# Patient Record
Sex: Female | Born: 1945 | Race: White | Hispanic: No | Marital: Married | Smoking: Never smoker
Health system: Southern US, Community
[De-identification: ages and names within clinical notes are randomized; demographics above are authoritative.]

## PROBLEM LIST (undated history)

## (undated) DIAGNOSIS — C541 Malignant neoplasm of endometrium: Secondary | ICD-10-CM

## (undated) DIAGNOSIS — R002 Palpitations: Secondary | ICD-10-CM

## (undated) DIAGNOSIS — K279 Peptic ulcer, site unspecified, unspecified as acute or chronic, without hemorrhage or perforation: Secondary | ICD-10-CM

## (undated) DIAGNOSIS — K219 Gastro-esophageal reflux disease without esophagitis: Secondary | ICD-10-CM

## (undated) DIAGNOSIS — N879 Dysplasia of cervix uteri, unspecified: Secondary | ICD-10-CM

## (undated) DIAGNOSIS — N841 Polyp of cervix uteri: Secondary | ICD-10-CM

## (undated) HISTORY — DX: Dysplasia of cervix uteri, unspecified: N87.9

## (undated) HISTORY — DX: Peptic ulcer, site unspecified, unspecified as acute or chronic, without hemorrhage or perforation: K27.9

## (undated) HISTORY — DX: Palpitations: R00.2

## (undated) HISTORY — PX: DILATION AND CURETTAGE OF UTERUS: SHX78

## (undated) HISTORY — PX: FOOT SURGERY: SHX648

## (undated) HISTORY — PX: TUBAL LIGATION: SHX77

## (undated) HISTORY — DX: Polyp of cervix uteri: N84.1

## (undated) HISTORY — PX: LEEP: SHX91

## (undated) HISTORY — DX: Malignant neoplasm of endometrium: C54.1

## (undated) HISTORY — PX: CHOLECYSTECTOMY: SHX55

---

## 2005-04-11 ENCOUNTER — Ambulatory Visit: Payer: Self-pay | Admitting: Family Medicine

## 2005-04-11 ENCOUNTER — Other Ambulatory Visit: Admission: RE | Admit: 2005-04-11 | Discharge: 2005-04-11 | Payer: Self-pay | Admitting: Family Medicine

## 2005-04-11 ENCOUNTER — Encounter: Payer: Self-pay | Admitting: Family Medicine

## 2005-05-04 ENCOUNTER — Ambulatory Visit: Payer: Self-pay | Admitting: Family Medicine

## 2005-09-26 ENCOUNTER — Ambulatory Visit: Payer: Self-pay | Admitting: Family Medicine

## 2006-01-29 ENCOUNTER — Ambulatory Visit: Payer: Self-pay | Admitting: Family Medicine

## 2006-04-03 DIAGNOSIS — B009 Herpesviral infection, unspecified: Secondary | ICD-10-CM | POA: Insufficient documentation

## 2006-04-03 DIAGNOSIS — G43909 Migraine, unspecified, not intractable, without status migrainosus: Secondary | ICD-10-CM | POA: Insufficient documentation

## 2006-04-03 DIAGNOSIS — J309 Allergic rhinitis, unspecified: Secondary | ICD-10-CM | POA: Insufficient documentation

## 2006-04-03 DIAGNOSIS — N952 Postmenopausal atrophic vaginitis: Secondary | ICD-10-CM | POA: Insufficient documentation

## 2006-04-03 DIAGNOSIS — K219 Gastro-esophageal reflux disease without esophagitis: Secondary | ICD-10-CM | POA: Insufficient documentation

## 2006-04-04 LAB — HM COLONOSCOPY: HM Colonoscopy: NORMAL

## 2006-04-22 ENCOUNTER — Encounter: Payer: Self-pay | Admitting: Family Medicine

## 2006-04-22 DIAGNOSIS — N879 Dysplasia of cervix uteri, unspecified: Secondary | ICD-10-CM | POA: Insufficient documentation

## 2006-08-03 ENCOUNTER — Ambulatory Visit: Payer: Self-pay | Admitting: Family Medicine

## 2006-08-03 DIAGNOSIS — I499 Cardiac arrhythmia, unspecified: Secondary | ICD-10-CM | POA: Insufficient documentation

## 2006-08-09 ENCOUNTER — Encounter: Payer: Self-pay | Admitting: Family Medicine

## 2006-08-15 ENCOUNTER — Ambulatory Visit: Payer: Self-pay | Admitting: Cardiology

## 2006-08-15 ENCOUNTER — Encounter: Payer: Self-pay | Admitting: Family Medicine

## 2006-08-20 ENCOUNTER — Ambulatory Visit: Payer: Self-pay | Admitting: Family Medicine

## 2006-08-31 ENCOUNTER — Encounter: Payer: Self-pay | Admitting: Cardiology

## 2006-08-31 ENCOUNTER — Ambulatory Visit: Payer: Self-pay

## 2006-09-06 ENCOUNTER — Encounter: Payer: Self-pay | Admitting: Family Medicine

## 2007-05-17 ENCOUNTER — Ambulatory Visit: Payer: Self-pay | Admitting: Family Medicine

## 2007-05-30 ENCOUNTER — Ambulatory Visit: Payer: Self-pay | Admitting: Family Medicine

## 2007-05-30 ENCOUNTER — Encounter: Admission: RE | Admit: 2007-05-30 | Discharge: 2007-05-30 | Payer: Self-pay | Admitting: Family Medicine

## 2008-03-23 ENCOUNTER — Other Ambulatory Visit: Admission: RE | Admit: 2008-03-23 | Discharge: 2008-03-23 | Payer: Self-pay | Admitting: Family Medicine

## 2008-03-23 ENCOUNTER — Encounter: Payer: Self-pay | Admitting: Family Medicine

## 2008-03-23 ENCOUNTER — Ambulatory Visit: Payer: Self-pay | Admitting: Family Medicine

## 2008-03-23 DIAGNOSIS — N951 Menopausal and female climacteric states: Secondary | ICD-10-CM | POA: Insufficient documentation

## 2008-03-24 ENCOUNTER — Encounter: Payer: Self-pay | Admitting: Family Medicine

## 2008-03-25 ENCOUNTER — Encounter: Payer: Self-pay | Admitting: Family Medicine

## 2008-03-26 ENCOUNTER — Encounter: Payer: Self-pay | Admitting: Family Medicine

## 2008-03-26 DIAGNOSIS — M949 Disorder of cartilage, unspecified: Secondary | ICD-10-CM

## 2008-03-26 DIAGNOSIS — M899 Disorder of bone, unspecified: Secondary | ICD-10-CM | POA: Insufficient documentation

## 2008-03-27 LAB — CONVERTED CEMR LAB
AST: 14 units/L (ref 0–37)
Calcium: 9.1 mg/dL (ref 8.4–10.5)
Chloride: 107 meq/L (ref 96–112)
Cholesterol: 172 mg/dL (ref 0–200)
Glucose, Bld: 106 mg/dL — ABNORMAL HIGH (ref 70–99)
Hemoglobin: 12.6 g/dL (ref 12.0–15.0)
LDL Cholesterol: 106 mg/dL — ABNORMAL HIGH (ref 0–99)
MCHC: 31.7 g/dL (ref 30.0–36.0)
MCV: 90.6 fL (ref 78.0–100.0)
Platelets: 230 10*3/uL (ref 150–400)
Potassium: 4.2 meq/L (ref 3.5–5.3)
RDW: 14.1 % (ref 11.5–15.5)
Sodium: 142 meq/L (ref 135–145)
Total Protein: 6.4 g/dL (ref 6.0–8.3)
VLDL: 22 mg/dL (ref 0–40)

## 2008-04-02 ENCOUNTER — Encounter: Payer: Self-pay | Admitting: Family Medicine

## 2008-04-02 ENCOUNTER — Ambulatory Visit: Payer: Self-pay | Admitting: Family Medicine

## 2008-04-02 DIAGNOSIS — R7301 Impaired fasting glucose: Secondary | ICD-10-CM | POA: Insufficient documentation

## 2008-04-03 ENCOUNTER — Telehealth (INDEPENDENT_AMBULATORY_CARE_PROVIDER_SITE_OTHER): Payer: Self-pay | Admitting: *Deleted

## 2008-04-03 LAB — CONVERTED CEMR LAB: Vit D, 1,25-Dihydroxy: 32 (ref 30–89)

## 2008-04-07 ENCOUNTER — Encounter: Payer: Self-pay | Admitting: Family Medicine

## 2008-04-10 ENCOUNTER — Encounter: Payer: Self-pay | Admitting: Family Medicine

## 2009-07-08 ENCOUNTER — Ambulatory Visit: Payer: Self-pay | Admitting: Family Medicine

## 2009-07-08 DIAGNOSIS — R002 Palpitations: Secondary | ICD-10-CM | POA: Insufficient documentation

## 2009-07-13 ENCOUNTER — Encounter: Admission: RE | Admit: 2009-07-13 | Discharge: 2009-07-13 | Payer: Self-pay | Admitting: Family Medicine

## 2009-07-13 ENCOUNTER — Encounter: Payer: Self-pay | Admitting: Family Medicine

## 2009-07-14 ENCOUNTER — Encounter: Payer: Self-pay | Admitting: Family Medicine

## 2009-07-15 LAB — CONVERTED CEMR LAB
ALT: 73 units/L — ABNORMAL HIGH (ref 0–35)
Albumin: 4.1 g/dL (ref 3.5–5.2)
BUN: 10 mg/dL (ref 6–23)
Calcium: 9.1 mg/dL (ref 8.4–10.5)
Chloride: 106 meq/L (ref 96–112)
HCT: 36.4 % (ref 36.0–46.0)
HDL: 42 mg/dL (ref >39)
MCHC: 34.6 g/dL (ref 30.0–36.0)
Potassium: 3.5 meq/L (ref 3.5–5.3)
Sodium: 139 meq/L (ref 135–145)
Total CHOL/HDL Ratio: 3.4
Total Protein: 6.6 g/dL (ref 6.0–8.3)
Vit D, 25-Hydroxy: 38 ng/mL (ref 30–89)
WBC: 3.9 10*3/uL — ABNORMAL LOW (ref 4.0–10.5)

## 2009-08-11 ENCOUNTER — Telehealth (INDEPENDENT_AMBULATORY_CARE_PROVIDER_SITE_OTHER): Payer: Self-pay | Admitting: *Deleted

## 2009-08-11 ENCOUNTER — Encounter: Payer: Self-pay | Admitting: Family Medicine

## 2009-08-11 DIAGNOSIS — R7989 Other specified abnormal findings of blood chemistry: Secondary | ICD-10-CM | POA: Insufficient documentation

## 2009-08-12 LAB — CONVERTED CEMR LAB: AST: 17 units/L (ref 0–37)

## 2010-03-11 IMAGING — OT DG DXA BONE DENSITY STUDY HL7
1 series · 1 of 1 positions shown · non-contrast
Comparison: None.

CLINICAL DATA: 63-year-old postmenopausal female with history of
gastroesophageal reflux.  The patient takes calcium and vitamin D.
She takes no current hormones or bone [HOSPITAL].

[Series 2: — · 1 of 1 slices shown]
[im 1/1]
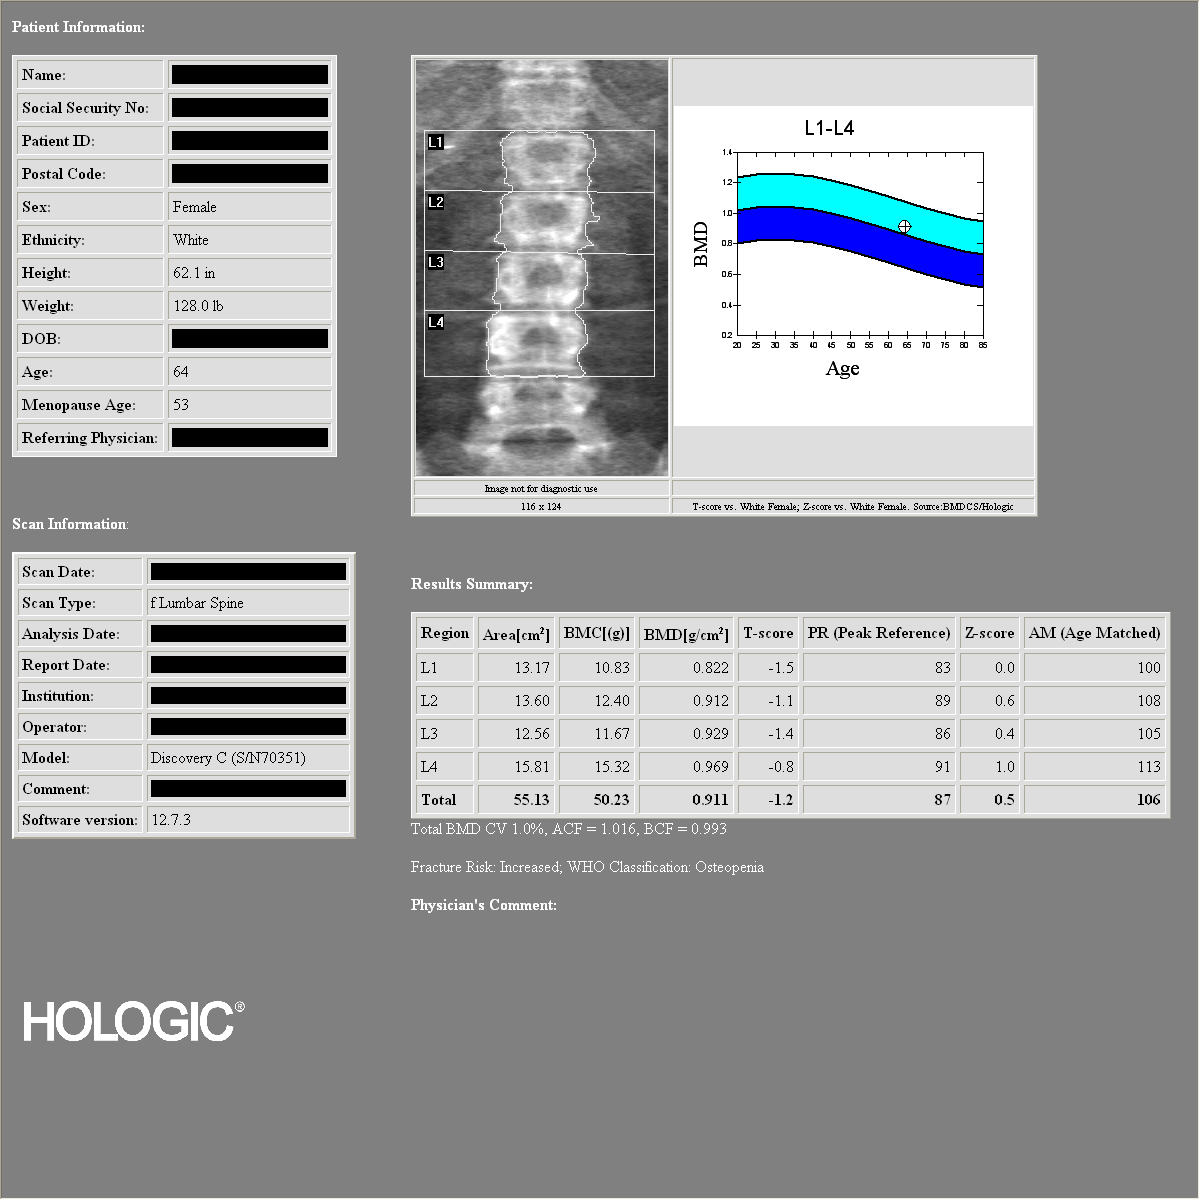

[1 of 1 positions shown; findings below may reference images not displayed]

DUAL X-RAY ABSORPTIOMETRY (DXA) FOR BONE MINERAL DENSITY

AP LUMBAR SPINE (L1 - L4)

Bone Mineral Density (BMD):            0.911 g/cm2
Young Adult T Score:                          -1.2
Z Score:

LEFT FEMUR (NECK)

Bone Mineral Density (BMD):             0.746 g/cm2
Young Adult T Score:                           -0.9
Z Score:

ASSESSMENT:  Patient's diagnostic category is LOW BONE MASS by WHO
Criteria.

FRACTURE RISK: MODERATE

FRAX: Based on the World Health Organization FRAX model, the 10
year probability of a major osteoporotic fracture is 15%.  The 10
year probability of a hip fracture is 0.5%.

FRAX is most useful in patients with low hip BMD.  The WHO
algorithm has not been validated for the use of spine BMD.  Using
FRAX in patients with low BMD at the spine but relatively normal
BMD at the hip requires special clinical consideration as FRAX may
underestimate fracture risk in these individuals.
Please note that data from different machines is
not comparable.

RECOMMENDATIONS:

Effective therapies are available in the form of bisphosphonates,
selective estrogen receptor modulators, biologic agents, and
hormone replacement therapy (for women).  All patients should
ensure an adequate intake of dietary calcium (1200mg daily) and
vitamin D (800 [address] Jim) unless contraindicated.

All treatment decisions require clinical judgement and
consideration of individual patient factors, including patient
preferences, co-morbidities, previous drug use, risk factors not
captured in the FRAX model (e.g., frailty, falls, vitamin D
deficiency, increased bone turnover, interval significant decline
in bone density) and possible under-or over-estimation of fracture
risk by FRAX.

The National Osteoporosis Foundation recommends that FDA-approved
medical therapies be considered in postmenopausal women and mean
age 50 or older with a:

      1)     Hip or vertebral (clinical or morphometric) fracture.

2)    T-score of -2.5 or lower at the spine or hip.
3)    Ten-year fracture probability by FRAX of 3% or greater for
hip fracture or 20% or greater for major osteoporotic fracture.
FOLLOW-UP:

People with diagnosed cases of osteoporosis or at high risk for
fracture should have regular bone mineral density tests.  For
patients eligible for Medicare, routine testing is allowed once
every 2 years.  The testing frequency can be increased to one year
for patients who have rapidly progressing disease, those who are
receiving or discontinuing medical therapy to restore bone mass, or
have additional risk factors.

World Health Organization (WHO) Criteria:

Normal: T scores from +1.0 to -1.0
Low Bone Mass (Osteopenia): T scores between -1.0 and -2.5
Osteoporosis: T scores -2.5 and below

Comparison to Reference Population:

T score is the key measure used in the diagnosis of osteoporosis
and relative risk determination for fracture.  It provides a value
for bone mass relative to the mean bone mass of a young adult
reference population expressed in terms of standard deviation (SD).

Z score is the age-matched score showing the patient's values
compared to a population matched for age, sex, and race.  This is
also expressed in terms of standard deviation.  The patient may
have values that compare favorably to the age-matched values and
still be at increased risk for fracture.

## 2010-07-18 ENCOUNTER — Encounter: Payer: Self-pay | Admitting: Family Medicine

## 2010-07-20 ENCOUNTER — Other Ambulatory Visit: Payer: Self-pay | Admitting: Family Medicine

## 2010-07-20 ENCOUNTER — Encounter: Payer: Self-pay | Admitting: Family Medicine

## 2010-07-20 ENCOUNTER — Encounter: Payer: Self-pay | Admitting: Physician Assistant

## 2010-07-20 ENCOUNTER — Ambulatory Visit
Admission: RE | Admit: 2010-07-20 | Discharge: 2010-07-20 | Payer: Self-pay | Source: Home / Self Care | Attending: Family Medicine | Admitting: Family Medicine

## 2010-07-20 DIAGNOSIS — R9431 Abnormal electrocardiogram [ECG] [EKG]: Secondary | ICD-10-CM | POA: Insufficient documentation

## 2010-07-20 DIAGNOSIS — Z0000 Encounter for general adult medical examination without abnormal findings: Secondary | ICD-10-CM

## 2010-07-20 LAB — CONVERTED CEMR LAB
HCT: 38.8 % (ref 36.0–46.0)
Hemoglobin: 12.8 g/dL (ref 12.0–15.0)
Platelets: 228 10*3/uL (ref 150–400)
WBC: 6.1 10*3/uL (ref 4.0–10.5)

## 2010-07-21 LAB — CONVERTED CEMR LAB
Alkaline Phosphatase: 55 units/L (ref 39–117)
BUN: 13 mg/dL (ref 6–23)
Calcium: 9.5 mg/dL (ref 8.4–10.5)
Chloride: 103 meq/L (ref 96–112)
Glucose, Bld: 107 mg/dL — ABNORMAL HIGH (ref 70–99)
LDL Cholesterol: 110 mg/dL — ABNORMAL HIGH (ref 0–99)
Sodium: 139 meq/L (ref 135–145)
TSH: 1.774 microintl units/mL (ref 0.350–4.500)
Vit D, 25-Hydroxy: 39 ng/mL (ref 30–89)

## 2010-07-26 NOTE — Progress Notes (Signed)
     New Problems: OTHER ABNORMAL BLOOD CHEMISTRY (ICD-790.6)   New Problems: OTHER ABNORMAL BLOOD CHEMISTRY (ICD-790.6)

## 2010-07-26 NOTE — Assessment & Plan Note (Signed)
Summary: CPE w/o pap   Vital Signs:  Patient profile:   65 year old female Height:      64 inches Weight:      131 pounds BMI:     22.57 O2 Sat:      98 % on Room air Temp:     98.8 degrees F oral Pulse rate:   84 / minute BP sitting:   125 / 75  (left arm) Cuff size:   regular  Vitals Entered By: Payton Spark CMA (July 08, 2009 12:56 PM)  O2 Flow:  Room air CC: CPE w/out pap   Primary Care Provider:  Seymour Bars DO  CC:  CPE w/out pap.  History of Present Illness: 65 yo WF presents for CPE without pap.  She has hx of cervical dysplasia.  Had a normal pap in 09.  She is married, monogamous with 3 grown children.  She is due for her mammogram and DEXA scan.  She has hx of osteopenia and low Vitamin D.  She saw GI last year and is no longer having constipation or diarrhea problems.  Her colonoscopy was updated in 07.  Her tetanus vaccine is UTD.  Given info on Zostavax.  Flu shot today.  She has 3 grown children.   Hx of IFG, heart palpitations, GERD.  Doing well.  EAting healthy, walking more.  Fasting labs are due.    Current Medications (verified): 1)  Calcarb 600/d 600-125 Mg-Unit Tabs (Calcium-Vitamin D) .Marland Kitchen.. 1 Tab By Mouth Two Times A Day Ac 2)  Multivitamins  Tabs (Multiple Vitamin) .Marland Kitchen.. 1 Tab By Mouth Qd 3)  Omeprazole 20 Mg Cpdr (Omeprazole) .Marland Kitchen.. 1 Tab By Mouth Qd 4)  Fish Oil 1000 Mg Caps (Omega-3 Fatty Acids) .... Take Daily As Directed  Allergies (verified): No Known Drug Allergies  Past History:  Past Medical History: Reviewed history from 04/22/2006 and no changes required. [id number]  hx of cervical dysplasia hx of cervical polyp  hx of PUD  proliferative endometrium  Past Surgical History: Reviewed history from 04/22/2006 and no changes required. BTL  Cholecystectomy  D&C, foot surgery  LEEP  Family History: Reviewed history from 04/22/2006 and no changes required. 1 brother- RA  father ?  mother alive and healthy  Social  History: Reviewed history from 04/03/2006 and no changes required. Married to [place] and has 3 grown children, grandchildren in area.  Nonsmoker.  Retired.  Wants to start exercising.  Review of Systems  The patient denies anorexia, fever, weight loss, weight gain, vision loss, decreased hearing, hoarseness, chest pain, syncope, dyspnea on exertion, peripheral edema, prolonged cough, headaches, hemoptysis, abdominal pain, melena, hematochezia, severe indigestion/heartburn, hematuria, incontinence, genital sores, muscle weakness, suspicious skin lesions, transient blindness, difficulty walking, depression, unusual weight change, abnormal bleeding, enlarged lymph nodes, angioedema, breast masses, and testicular masses.    Physical Exam  General:  alert, well-developed, well-nourished, and well-hydrated.   Head:  normocephalic and atraumatic.   Eyes:  PERRLA Ears:  EACs patent; TMs translucent and gray with good cone of light and bony landmarks.  Nose:  no nasal discharge.   Mouth:  good dentition and pharynx pink and moist.   Neck:  supple and no masses.  no audible carotid bruits Lungs:  Normal respiratory effort, chest expands symmetrically. Lungs are clear to auscultation, no crackles or wheezes. Heart:  Normal rate and regular rhythm. S1 and S2 normal without gallop, murmur, click, rub or other extra sounds. Abdomen:  Bowel sounds positive,abdomen soft and non-tender  without masses, organomegaly or hernias noted. Msk:  no joint tenderness, no joint swelling, and no joint warmth.   Pulses:  2+ radial and pedal pulses Extremities:  no E/C/C Neurologic:  gait normal and DTRs symmetrical and normal.   Skin:  color normal.   Cervical Nodes:  No lymphadenopathy noted Psych:  good eye contact, not anxious appearing, and not depressed appearing.     Impression & Recommendations:  Problem # 1:  HEALTH MAINTENANCE EXAM (ICD-V70.0) Keeping healthy checklist for women reviewed. BP at goal.   BMI at goal. Update mammogram and DEXA scan.  Update fasting labs and flu shot.  PNX at 64. Colonoscopy due 2017 with Td. Not on any RX meds. Work on [employer], regular exercise, MVI and Calcium with Vitamin D daily.  Complete Medication List: 1)  Calcarb 600/d 600-125 Mg-unit Tabs (Calcium-vitamin d) .Marland Kitchen.. 1 tab by mouth two times a day ac 2)  Multivitamins Tabs (Multiple vitamin) .Marland Kitchen.. 1 tab by mouth qd 3)  Omeprazole 20 Mg Cpdr (Omeprazole) .Marland Kitchen.. 1 tab by mouth qd 4)  Fish Oil 1000 Mg Caps (Omega-3 fatty acids) .... Take daily as directed  Other Orders: T-Mammography Bilateral Screening (78295) T-DXA Bone Density/ Appendicular (62130) T-Dual DXA Bone Density/ Axial (86578) T-CBC No Diff (46962-95284) T-Comprehensive Metabolic Panel (13244-01027) T-Lipid Profile [phone]) T-Vitamin D (25-Hydroxy) (74259-56387) T-TSH (56433-29518)  Patient Instructions: 1)  Have fasting labs drawn. 2)  Will call you w/ results. 3)  Have mammogram and DEXA scan updated downstairs. 4)  Flu shot today. 5)  Return for f/u in 6 months to recheck fasting sugar in the office.

## 2010-07-28 NOTE — Assessment & Plan Note (Signed)
Summary: Welcome to Medicare Phys   Vital Signs:  Patient profile:   66 year old female Height:      64 inches Weight:      133 pounds BMI:     22.91 Pulse rate:   72 / minute BP sitting:   131 / 79  (right arm) Cuff size:   regular  Vitals Entered By: Avon Gully CMA, Duncan Dull) (July 20, 2010 8:37 AM) CC: medicare physical  Vision Screening:Left eye w/o correction: 20 / 30 Right Eye w/o correction: 20 / 30 Both eyes w/o correction:  20/ 25  Color vision testing: normal      Vision Entered By: Payton Spark CMA (July 20, 2010 9:37 AM)   Primary Care Provider:  Seymour Bars DO  CC:  medicare physical.  History of Present Illness: I have personally reviewed the Medicare Annual Wellness questionnaire and have noted 1.   The patient's medical and social history 2.   Their use of alcohol, tobacco or illicit drugs 3.   Their current medications and supplements 4.   The patient's functional ability including ADL's, fall risks, home safety risks and hearing or visual             impairment. 5.   Diet and physical activities 6.   Evidence for depression or mood disorders The patients weight, height, BMI and visual acuity have been recorded in the chart I have made referrals, counseling and provided education to the patient based review of the above and I have provided the pt with a written personalized care plan for preventive services.    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 1 Tab By Mouth Two Times A Day Ac 2)  Multivitamins  Tabs (Multiple Vitamin) .Marland Kitchen.. 1 Tab By Mouth Qd 3)  Omeprazole 20 Mg Cpdr (Omeprazole) .Marland Kitchen.. 1 Tab By Mouth Qd 4)  Fish Oil 1000 Mg Caps (Omega-3 Fatty Acids) .... Take Daily As Directed  Current Medications (verified): 1)  Calcarb 600/d 600-125 Mg-Unit Tabs (Calcium-Vitamin D) .Marland Kitchen.. 1 Tab By Mouth Two Times A Day Ac 2)  Multivitamins  Tabs (Multiple Vitamin) .Marland Kitchen.. 1 Tab By Mouth Qd 3)  Omeprazole 20 Mg Cpdr (Omeprazole) .Marland Kitchen.. 1 Tab By Mouth Qd 4)  Fish Oil 1000 Mg Caps (Omega-3 Fatty Acids) .... Take Daily As Directed  Allergies (verified): No Known Drug Allergies  Directives (verified): 1)  Pt Deferred   Comments:  Nurse/Medical Assistant: The patient's medications and allergies were reviewed with the patient and were updated in the Medication and Allergy Lists. Avon Gully CMA, Duncan Dull) (July 20, 2010 8:38 AM)  Past History:  Past Medical History: Last updated: 04/22/2006 [id number]  hx of cervical dysplasia hx of cervical polyp  hx of PUD  proliferative endometrium  Past Surgical History: Last updated: 04/22/2006 BTL  Cholecystectomy  D&C, foot surgery  LEEP  Family History: Last updated: 04/22/2006 1 brother- RA  father ?  mother alive and healthy  Social History: Last updated: 04/03/2006 Married to [place] and has 3 grown children, grandchildren in area.  Nonsmoker.  Retired.  Wants to start exercising.  Risk Factors: Smoking Status: never (07/20/2010)  Physical Exam  General:  alert, well-developed, well-nourished, and well-hydrated.   Head:  normocephalic and atraumatic.   Ears:  no external deformities.   Nose:  no nasal discharge.   Mouth:  pharynx pink and moist and fair dentition.     Neck:  no masses.  no audlble carotid bruits Lungs:  Normal respiratory effort, chest expands symmetrically. Lungs are clear to auscultation, no crackles or wheezes. Heart:  Normal rate and regular rhythm. S1 and S2 normal without gallop, murmur, click, rub or other extra sounds. Abdomen:  soft, non-tender, normal bowel sounds, no distention, no masses, no guarding, no hepatomegaly, and no splenomegaly.  no AA brutis Pulses:  2+ radial and pedal pulses Extremities:  no LE edema Skin:  color normal.     Impression & Recommendations:  Problem # 1:  HEALTH MAINTENANCE EXAM (ICD-V70.0) BP/ BMI at goal. Update fasting labs.  Order for mammogram given. DEXA due in 1 yr. MVI + Calcium with D + healthy diet and regular exercise encouraged. EKG today shows lateral T wave flattening. PNX given. RX for Zostavax given; plan to get in 4 wks. Colonoscopy normal 07. Pap / CBE due this Fall. Schedule stress test Orders: Welcome to Medicare, Physical [phone])  Complete Medication List: 1)  Calcarb 600/d 600-125 Mg-unit Tabs (Calcium-vitamin d) .Marland Kitchen.. 1 tab by mouth two times a day ac 2)  Multivitamins Tabs (Multiple vitamin) .Marland Kitchen.. 1 tab by mouth qd 3)  Fish Oil 1000 Mg Caps (Omega-3 fatty acids) .... Take daily as directed 4)  Zostavax  .... Administer x 1  Other Orders: T-Mammography Bilateral Screening (96295) T-CBC No Diff (28413-24401) T-Comprehensive Metabolic Panel [phone]) T-Lipid Profile [phone] T-Vitamin D (25-Hydroxy) (38756-43329) T-TSH (51884-16606) Pneumococcal Vaccine (30160) Admin 1st Vaccine (10932) EKG w/ Interpretation (93000) T-Treadmill Complete/ Pharmacologic Stress (35573)  Patient Instructions: 1)  EKG abnormal.  Will get Dr Jens Som to update your stress test. 2)  Keep up the good work! 3)  REturn for PAP smear in 6 mos. 4)  I have provided you with a copy of your personalized plan for preventive services. Please take the time to review along with your  updated medication list. Prescriptions: ZOSTAVAX Administer x 1  #1 x 0   Entered and Authorized by:   Seymour Bars DO   Signed by:   Seymour Bars DO on 07/20/2010   Method used:   Printed then faxed to ...       [facility]  [phone]* (retail)       [address] [place], [place]  [address]       Ph: [phone] or [phone]       Fax: [phone]   RxID:   [id number]    Orders Added: 1)  T-Mammography Bilateral Screening [77057] 2)  T-CBC No Diff [[address]27-10000] 3)  T-Comprehensive Metabolic Panel [80053-22900] 4)  T-Lipid Profile [80061-22930] 5)  T-Vitamin D (25-Hydroxy) [71696-78938] 6)  T-TSH [10175-10258] 7)  Pneumococcal Vaccine [90732] 8)  Admin 1st Vaccine [90471] 9)  Welcome to Medicare, Physical [G0402] 10)  EKG w/ Interpretation [93000] 11)  T-Treadmill Complete/ Pharmacologic Stress [93015]   Immunizations Administered:  Pneumonia Vaccine:  Vaccine Type: Pneumovax (Medicare)    Site: left deltoid    Dose: 0.5 ml    Route: IM    Given by: Payton Spark CMA    Exp. Date: 10/21/2011    Lot #: [id number]    VIS given: 05/31/09 version given July 20, 2010.   Immunizations Administered:  Pneumonia Vaccine:    Vaccine Type: Pneumovax (Medicare)    Site: left deltoid    Dose: 0.5 ml    Route: IM    Given by: Payton Spark CMA    Exp. Date: 10/21/2011    Lot #: [id number]    VIS given: 05/31/09 version given July 20, 2010.   Orders Added: 1)  T-Mammography Bilateral Screening [77057] 2)  T-CBC No Diff [[address]] 3)  T-Comprehensive Metabolic Panel [80053-22900] 4)  T-Lipid Profile [80061-22930] 5)  T-Vitamin D (25-Hydroxy) [81191-47829] 6)  T-TSH [56213-08657] 7)  Pneumococcal Vaccine [90732] 8)  Admin 1st Vaccine [90471] 9)  Welcome to Medicare, Physical [G0402] 10)  EKG w/ Interpretation [93000] 11)  T-Treadmill Complete/ Pharmacologic Stress [93015]

## 2010-08-02 ENCOUNTER — Ambulatory Visit
Admission: RE | Admit: 2010-08-02 | Discharge: 2010-08-02 | Disposition: A | Discharge status: Home / Self Care | Payer: Medicare Other | Source: Ambulatory Visit | Attending: Family Medicine | Admitting: Family Medicine

## 2010-08-02 DIAGNOSIS — Z0000 Encounter for general adult medical examination without abnormal findings: Secondary | ICD-10-CM

## 2010-08-03 ENCOUNTER — Encounter: Payer: Self-pay | Admitting: Physician Assistant

## 2010-08-03 LAB — HM MAMMOGRAPHY: HM Mammogram: NEGATIVE

## 2010-08-04 ENCOUNTER — Encounter: Payer: Self-pay | Admitting: Physician Assistant

## 2010-08-08 ENCOUNTER — Encounter: Payer: Self-pay | Admitting: Physician Assistant

## 2010-08-11 ENCOUNTER — Encounter (INDEPENDENT_AMBULATORY_CARE_PROVIDER_SITE_OTHER): Payer: Medicare Other | Admitting: Physician Assistant

## 2010-08-11 ENCOUNTER — Encounter: Payer: Self-pay | Admitting: Physician Assistant

## 2010-08-11 DIAGNOSIS — R9431 Abnormal electrocardiogram [ECG] [EKG]: Secondary | ICD-10-CM

## 2010-08-31 ENCOUNTER — Telehealth: Payer: Self-pay | Admitting: Family Medicine

## 2010-09-06 NOTE — Progress Notes (Signed)
Summary: Unable to afford Zostavax  Phone Note Call from Patient Call back at [facility] Phone [phone]   Caller: Patient Summary of Call: pt called-she is unable to afford Zostavax right now.  She is still interested and will come for injection when she is able to afford.  spoke with pt and advised OK and I will make note in chart.  No nurse appt in [facility] to cancel. Initial call taken by: Francee Piccolo CMA Duncan Dull),  August 31, 2010 9:40 AM

## 2010-09-13 NOTE — Letter (Signed)
Summary: Jonn Shingles: Test Order Form  [facility] [place]: Test Order Form   Imported By: Earl Many 09/02/2010 14:57:29  _____________________________________________________________________  External Attachment:    Type:   Image     Comment:   External Document

## 2010-11-11 NOTE — Assessment & Plan Note (Signed)
[facility]                            CARDIOLOGY OFFICE NOTE   NAME:Vejar, CHARLETHA DALPE                      MRN:          [id number]  DATE:08/15/2006                            DOB:          10-Aug-1945    Mrs. Muller is a pleasant 65 year old female with no prior cardiac  history who we are asked to evaluate for palpitations.  She states that  she can have dyspnea when going up inclines but typically does not have  dyspnea on exertion, orthopnea, PND, pedal edema, presyncope, syncope,  or exertional chest pain.  Over the past 5 to 6 months, she has had  occasional palpitations.  They are sudden in onset and last for 1-2  seconds and resolve spontaneously.  She describes it as a brief flutter.  She states she feels funny, but there is no syncope, chest pain, or  shortness of breath.  These typically occur every 2-3 weeks.  She was  recently seen by Dr. Cathey Endow, and we were asked to further evaluate.  Her  medications at present include omeprazole 20 mg p.o. daily and a  multivitamin daily.   ALLERGIES:  No known drug allergies.   SOCIAL HISTORY:  She does not smoke, nor does she consume alcohol.  She  drinks approximately 2 caffeinated beverages in the morning.   FAMILY HISTORY:  Negative for coronary artery disease.   PAST MEDICAL HISTORY:  There is no diabetes mellitus, hypertension, or  hyperlipidemia.  She does have a history of gastroesophageal reflux  disease.  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  She has also  had a cholecystectomy.  She recently had an evaluation for pain in the  right breast, but apparently that workup has been unremarkable.   REVIEW OF SYSTEMS:  She denies any headaches or fevers or chills.  There  is no productive cough or hemoptysis.  There is no dysphagia,  odynophagia, melena or hematochezia.  There is no dysuria or hematuria.  There is no rash or seizure activity.  There is no orthopnea, PND, or  pedal  edema.  There is occasional arthritis.  She also complains of  cramping in her lower extremities.  The remaining systems are negative.   PHYSICAL EXAMINATION:  Her physical examination today shows a blood  pressure of 131/82 and her pulse is 80.  She weighs 138 pounds.  She is  well-nourished, well-developed, in no acute distress.  Her skin is warm  and dry.  She does not appear depressed, and there is no peripheral  clubbing.  Her back is normal.  HEENT:  Unremarkable.  NECK:  Supple, with a normal upstroke bilaterally, and no bruits noted.  There is no jugular venous distention or thyromegaly.  CHEST:  Clear to auscultation.  CARDIOVASCULAR:  Regular rate and rhythm with normal S1 and S2.  There  are no murmurs, rubs or gallops noted.  Her PMI is nondisplaced.  ABDOMEN:  Soft, nontender, nondistended.  Positive bowel sounds.  No  hepatosplenomegaly, no mass appreciated.  There is no abdominal bruit.  EXTREMITIES:  She has 2+  femoral pulses bilaterally with no bruits.  Her  extremities show no edema that I can palpate.  She has 2+ dorsalis pedis  pulses bilaterally.  NEUROLOGICAL EXAM:  Grossly intact.   I do have an electrocardiogram from Dr. Ovidio Kin office.  This shows a  sinus rhythm at a rate of 71.  There is a mild left axis deviation and a  RV conduction delay.  There are minor nonspecific ST changes.   DIAGNOSES:  1. Palpitations.  2. History of gastroesophageal reflux disease.   PLAN:  Mrs. Arauz presents for evaluation of palpitations.  These  appear to be self limiting and not particularly bothersome at this  point.  They are also short lived, lasting for 1 to 2 seconds.  These  certainly could be PACs or PVCs.  We discussed the options today,  including CardioNet monitor.  Given that they are self limited, we have  elected to follow this.  If they increase in frequency or severity, then  we will proceed with a monitor at that point.  I will schedule her to  have an  echocardiogram to quantify her left ventricular function.  If  this is normal, we will see her back on an as-needed basis and again  perform the monitor if her symptoms worsen.  I will check a TSH to  exclude hyperthyroidism, and also potassium given her leg cramps.     Madolyn Frieze Jens Som, MD, [facility]  Electronically Signed    BSC/MedQ  DD: 08/15/2006  DT: 08/15/2006  Job #: [id number]   cc:   Seymour Bars, D.O.

## 2010-12-05 ENCOUNTER — Ambulatory Visit: Payer: Self-pay | Admitting: Family Medicine

## 2010-12-31 ENCOUNTER — Encounter: Payer: Self-pay | Admitting: Family Medicine

## 2011-01-02 ENCOUNTER — Encounter: Payer: Self-pay | Admitting: Family Medicine

## 2011-01-02 ENCOUNTER — Ambulatory Visit (INDEPENDENT_AMBULATORY_CARE_PROVIDER_SITE_OTHER): Payer: Medicare Other | Admitting: Family Medicine

## 2011-01-02 ENCOUNTER — Other Ambulatory Visit (HOSPITAL_COMMUNITY)
Admission: RE | Admit: 2011-01-02 | Discharge: 2011-01-02 | Disposition: A | Discharge status: Home / Self Care | Payer: Medicare Other | Source: Ambulatory Visit | Attending: Family Medicine | Admitting: Family Medicine

## 2011-01-02 VITALS — BP 125/73 | HR 67 | Ht 62.0 in | Wt 132.0 lb

## 2011-01-02 DIAGNOSIS — Z01419 Encounter for gynecological examination (general) (routine) without abnormal findings: Secondary | ICD-10-CM

## 2011-01-02 NOTE — Patient Instructions (Signed)
Return in 4 months for a FLU SHOT and repeat FASTING GLUCOSE.  Colonoscopy done 2007 normal. DEXA Jan 2011  Mammogram Jan 25th, 2012. Exercise Treadmill 07-2010.

## 2011-01-02 NOTE — Progress Notes (Signed)
  Subjective:    Patient ID: Theresa Riggs, female    DOB: 1945/10/13, 65 y.o.   MRN: [id number]  HPI  65 yo WF presents for CPE w/ pap.  She is not on HRT.  She is due for fasting labs.  She is feeling well.  No chest pain or DOE.  She had a colonoscopy in 2007 that was normal.  She had  DEXA 06-2009 that showed osteopenia, a mammogram in Jan that was normal, an ETT in Feb that was normal and all of her immunizations are UTD.  She had labs done in Jan of this year and has some IFG.   BP 125/73  Pulse 67  Ht 5\' 2"  (1.575 m)  Wt 132 lb (59.875 kg)  BMI 24.14 kg/m2  SpO2 99%   Review of Systems Gen: no fevers, chills, hot flashes, night sweats, change in weight GI: no N/V/C/D GU: no dysuria, incontinence or sexual dysfunction CV: no chest pain, DOE, palpitations s or edema Pulm:  Denies CP, SOB or chronic cough      Objective:   Physical Exam  Genitourinary: Vagina normal and uterus normal. No breast swelling, tenderness, discharge or bleeding. Cervix exhibits no discharge. Right adnexum displays no mass, no tenderness and no fullness. Left adnexum displays no mass, no tenderness and no fullness. No erythema (atrophy) around the vagina.      Gen: alert, well groomed in NAD Neck: no thyromegaly or cervical lymphadenopathy CV: RRR w/o murmur, no audible carotid bruits or abdominal aortic bruits Ext: no edema, clubbing or cyanosis Lungs: CTA bilat w/o W/R/R; nonlabored HEENT:  [place]/AT; PERRLA; oropharynx pink and moist with good dentition Abd: soft, NT, ND, NABS, No HSM, no audible AA bruits Skin: warm and dry; no rash, pallor or jaundice Psych: does not appear anxious or depressed; answers questions appropriately     Assessment & Plan:  Assesment:  1. CPE- Keeping healthy checklist for women reviewed today.  BP at goal.  BMI (24  in the normal range.     Labs UTD Colonoscopy UTD, 07 Last pap/- today Mammogram- done Jan 25th 2012 Encouraged healthy diet, regular exercise, MVI  daily. Return for next physical in 1 yr.   Had a normal ETT Feb 2012 and DEXA Jan 2011.

## 2011-01-05 ENCOUNTER — Telehealth: Payer: Self-pay | Admitting: Family Medicine

## 2011-01-05 NOTE — Telephone Encounter (Signed)
Pt aware of the above  

## 2011-01-05 NOTE — Telephone Encounter (Signed)
Pls let pt know that her pap smear came back normal.  Repeat in 2-3 yrs.

## 2011-05-09 ENCOUNTER — Ambulatory Visit: Payer: Medicare Other

## 2013-08-14 ENCOUNTER — Encounter: Payer: Self-pay | Admitting: Physician Assistant

## 2019-05-12 ENCOUNTER — Other Ambulatory Visit: Payer: Self-pay | Admitting: Orthopedic Surgery

## 2019-05-12 ENCOUNTER — Encounter (HOSPITAL_BASED_OUTPATIENT_CLINIC_OR_DEPARTMENT_OTHER): Payer: Self-pay | Admitting: *Deleted

## 2019-05-12 ENCOUNTER — Other Ambulatory Visit: Payer: Self-pay

## 2019-05-13 ENCOUNTER — Other Ambulatory Visit (HOSPITAL_COMMUNITY)
Admission: RE | Admit: 2019-05-13 | Discharge: 2019-05-13 | Disposition: A | Discharge status: Home / Self Care | Payer: Medicare HMO | Source: Ambulatory Visit | Attending: Orthopedic Surgery | Admitting: Orthopedic Surgery

## 2019-05-13 DIAGNOSIS — Z20828 Contact with and (suspected) exposure to other viral communicable diseases: Secondary | ICD-10-CM | POA: Insufficient documentation

## 2019-05-13 DIAGNOSIS — Z01812 Encounter for preprocedural laboratory examination: Secondary | ICD-10-CM | POA: Insufficient documentation

## 2019-05-14 LAB — NOVEL CORONAVIRUS, NAA (HOSP ORDER, SEND-OUT TO REF LAB; TAT 18-24 HRS): SARS-CoV-2, NAA: NOT DETECTED

## 2019-05-16 ENCOUNTER — Encounter (HOSPITAL_BASED_OUTPATIENT_CLINIC_OR_DEPARTMENT_OTHER): Payer: Self-pay | Admitting: Emergency Medicine

## 2019-05-16 ENCOUNTER — Ambulatory Visit (HOSPITAL_BASED_OUTPATIENT_CLINIC_OR_DEPARTMENT_OTHER): Payer: Medicare HMO | Admitting: Anesthesiology

## 2019-05-16 ENCOUNTER — Other Ambulatory Visit: Payer: Self-pay

## 2019-05-16 ENCOUNTER — Encounter (HOSPITAL_BASED_OUTPATIENT_CLINIC_OR_DEPARTMENT_OTHER)
Admission: RE | Disposition: A | Discharge status: Home / Self Care | Payer: Self-pay | Source: Home / Self Care | Attending: Orthopedic Surgery

## 2019-05-16 ENCOUNTER — Ambulatory Visit (HOSPITAL_BASED_OUTPATIENT_CLINIC_OR_DEPARTMENT_OTHER)
Admission: RE | Admit: 2019-05-16 | Discharge: 2019-05-16 | Disposition: A | Discharge status: Home / Self Care | Payer: Medicare HMO | Attending: Orthopedic Surgery | Admitting: Orthopedic Surgery

## 2019-05-16 DIAGNOSIS — M67442 Ganglion, left hand: Secondary | ICD-10-CM | POA: Insufficient documentation

## 2019-05-16 DIAGNOSIS — K219 Gastro-esophageal reflux disease without esophagitis: Secondary | ICD-10-CM | POA: Insufficient documentation

## 2019-05-16 DIAGNOSIS — M19042 Primary osteoarthritis, left hand: Secondary | ICD-10-CM | POA: Insufficient documentation

## 2019-05-16 HISTORY — DX: Gastro-esophageal reflux disease without esophagitis: K21.9

## 2019-05-16 HISTORY — PX: MASS EXCISION: SHX2000

## 2019-05-16 SURGERY — EXCISION MASS
Anesthesia: Regional | Site: Finger | Laterality: Left

## 2019-05-16 MED ORDER — PROPOFOL 500 MG/50ML IV EMUL
INTRAVENOUS | Status: DC | PRN
  Administered 2019-05-16: 75 ug/kg/min via INTRAVENOUS

## 2019-05-16 MED ORDER — FENTANYL CITRATE (PF) 100 MCG/2ML IJ SOLN
INTRAMUSCULAR | Status: AC
  Filled 2019-05-16: qty 2

## 2019-05-16 MED ORDER — CEFAZOLIN SODIUM-DEXTROSE 2-4 GM/100ML-% IV SOLN
INTRAVENOUS | Status: AC
  Filled 2019-05-16: qty 100

## 2019-05-16 MED ORDER — PROPOFOL 10 MG/ML IV BOLUS
INTRAVENOUS | Status: DC | PRN
  Administered 2019-05-16: 20 mg via INTRAVENOUS

## 2019-05-16 MED ORDER — CHLORHEXIDINE GLUCONATE 4 % EX LIQD: 60.0000 mL | Freq: Once | CUTANEOUS | Status: DC

## 2019-05-16 MED ORDER — BUPIVACAINE HCL (PF) 0.25 % IJ SOLN
INTRAMUSCULAR | Status: DC | PRN
  Administered 2019-05-16: 8 mL

## 2019-05-16 MED ORDER — FENTANYL CITRATE (PF) 100 MCG/2ML IJ SOLN: 25.0000 ug | INTRAMUSCULAR | Status: DC | PRN

## 2019-05-16 MED ORDER — TRAMADOL HCL 50 MG PO TABS: 50.0000 mg | ORAL_TABLET | Freq: Four times a day (QID) | ORAL | 0 refills | Status: DC | PRN

## 2019-05-16 MED ORDER — LIDOCAINE HCL (PF) 0.5 % IJ SOLN
INTRAMUSCULAR | Status: DC | PRN
  Administered 2019-05-16: 40 mL via INTRAVENOUS

## 2019-05-16 MED ORDER — ONDANSETRON HCL 4 MG/2ML IJ SOLN
4.0000 mg | Freq: Once | INTRAMUSCULAR | Status: AC | PRN
  Administered 2019-05-16: 4 mg via INTRAVENOUS

## 2019-05-16 MED ORDER — LACTATED RINGERS IV SOLN
INTRAVENOUS | Status: DC
  Administered 2019-05-16: 13:00:00 via INTRAVENOUS

## 2019-05-16 MED ORDER — ACETAMINOPHEN 500 MG PO TABS: 1000.0000 mg | ORAL_TABLET | Freq: Once | ORAL | Status: DC

## 2019-05-16 MED ORDER — CEFAZOLIN SODIUM-DEXTROSE 2-4 GM/100ML-% IV SOLN
2.0000 g | INTRAVENOUS | Status: AC
  Administered 2019-05-16: 2 g via INTRAVENOUS

## 2019-05-16 MED ORDER — FENTANYL CITRATE (PF) 100 MCG/2ML IJ SOLN
INTRAMUSCULAR | Status: DC | PRN
  Administered 2019-05-16: 50 ug via INTRAVENOUS

## 2019-05-16 SURGICAL SUPPLY — 48 items
APL PRP STRL LF DISP 70% ISPRP (MISCELLANEOUS) ×1
BLADE SURG 15 STRL LF DISP TIS (BLADE) ×1 IMPLANT
BLADE SURG 15 STRL SS (BLADE) ×2
BNDG CMPR 9X4 STRL LF SNTH (GAUZE/BANDAGES/DRESSINGS) ×1
BNDG COHESIVE 1X5 TAN STRL LF (GAUZE/BANDAGES/DRESSINGS) ×1 IMPLANT
BNDG COHESIVE 2X5 TAN STRL LF (GAUZE/BANDAGES/DRESSINGS) IMPLANT
BNDG COHESIVE 3X5 TAN STRL LF (GAUZE/BANDAGES/DRESSINGS) IMPLANT
BNDG ESMARK 4X9 LF (GAUZE/BANDAGES/DRESSINGS) ×1 IMPLANT
BNDG GAUZE ELAST 4 BULKY (GAUZE/BANDAGES/DRESSINGS) IMPLANT
CHLORAPREP W/TINT 26 (MISCELLANEOUS) ×2 IMPLANT
CORD BIPOLAR FORCEPS 12FT (ELECTRODE) ×2 IMPLANT
COVER BACK TABLE REUSABLE LG (DRAPES) ×2 IMPLANT
COVER MAYO STAND REUSABLE (DRAPES) ×2 IMPLANT
COVER WAND RF STERILE (DRAPES) IMPLANT
CUFF TOURN SGL QUICK 18X4 (TOURNIQUET CUFF) ×1 IMPLANT
DECANTER SPIKE VIAL GLASS SM (MISCELLANEOUS) IMPLANT
DRAIN PENROSE 1/2X12 LTX STRL (WOUND CARE) IMPLANT
DRAPE EXTREMITY T 121X128X90 (DISPOSABLE) ×2 IMPLANT
DRAPE SURG 17X23 STRL (DRAPES) ×2 IMPLANT
GAUZE SPONGE 4X4 12PLY STRL (GAUZE/BANDAGES/DRESSINGS) ×2 IMPLANT
GAUZE XEROFORM 1X8 LF (GAUZE/BANDAGES/DRESSINGS) ×2 IMPLANT
GLOVE BIO SURGEON STRL SZ 6.5 (GLOVE) ×1 IMPLANT
GLOVE BIOGEL PI IND STRL 8.5 (GLOVE) ×1 IMPLANT
GLOVE BIOGEL PI INDICATOR 8.5 (GLOVE) ×1
GLOVE EXAM NITRILE MD LF STRL (GLOVE) ×1 IMPLANT
GLOVE SURG ORTHO 8.0 STRL STRW (GLOVE) ×2 IMPLANT
GOWN STRL REUS W/ TWL LRG LVL3 (GOWN DISPOSABLE) ×1 IMPLANT
GOWN STRL REUS W/TWL LRG LVL3 (GOWN DISPOSABLE) ×2
GOWN STRL REUS W/TWL XL LVL3 (GOWN DISPOSABLE) ×2 IMPLANT
MARKER SKIN DUAL TIP RULER LAB (MISCELLANEOUS) ×1 IMPLANT
NDL PRECISIONGLIDE 27X1.5 (NEEDLE) ×1 IMPLANT
NDL SAFETY ECLIPSE 18X1.5 (NEEDLE) IMPLANT
NEEDLE HYPO 18GX1.5 SHARP (NEEDLE)
NEEDLE PRECISIONGLIDE 27X1.5 (NEEDLE) ×2 IMPLANT
NS IRRIG 1000ML POUR BTL (IV SOLUTION) ×2 IMPLANT
PACK BASIN DAY SURGERY FS (CUSTOM PROCEDURE TRAY) ×2 IMPLANT
PAD CAST 3X4 CTTN HI CHSV (CAST SUPPLIES) IMPLANT
PADDING CAST COTTON 3X4 STRL (CAST SUPPLIES)
SPLINT FINGER 4.25 BULB 911906 (SOFTGOODS) ×1 IMPLANT
SPLINT PLASTER CAST XFAST 3X15 (CAST SUPPLIES) IMPLANT
SPLINT PLASTER XTRA FASTSET 3X (CAST SUPPLIES)
STOCKINETTE 4X48 STRL (DRAPES) ×2 IMPLANT
SUT ETHILON 4 0 PS 2 18 (SUTURE) ×2 IMPLANT
SUT VIC AB 4-0 P2 18 (SUTURE) IMPLANT
SYR BULB 3OZ (MISCELLANEOUS) ×2 IMPLANT
SYR CONTROL 10ML LL (SYRINGE) ×2 IMPLANT
TOWEL GREEN STERILE FF (TOWEL DISPOSABLE) ×2 IMPLANT
UNDERPAD 30X36 HEAVY ABSORB (UNDERPADS AND DIAPERS) ×2 IMPLANT

## 2019-05-16 NOTE — Discharge Instructions (Signed)

## 2019-05-16 NOTE — Anesthesia Preprocedure Evaluation (Addendum)
Anesthesia Evaluation  Patient identified by MRN, date of birth, ID band Patient awake    Reviewed: Allergy & Precautions, NPO status , Patient's Chart, lab work & pertinent test results  Airway Mallampati: I  TM Distance: >3 FB Neck ROM: Full    Dental no notable dental hx.    Pulmonary neg pulmonary ROS,    Pulmonary exam normal breath sounds clear to auscultation       Cardiovascular negative cardio ROS Normal cardiovascular exam Rhythm:Regular Rate:Normal     Neuro/Psych  Headaches, negative psych ROS   GI/Hepatic Neg liver ROS, PUD, GERD  Medicated and Controlled,  Endo/Other  negative endocrine ROS  Renal/GU negative Renal ROS     Musculoskeletal negative musculoskeletal ROS (+)   Abdominal   Peds  Hematology negative hematology ROS (+)   Anesthesia Other Findings MUCOID CYST LEFT MIDDLE FINGER  Reproductive/Obstetrics                            Anesthesia Physical Anesthesia Plan  ASA: II  Anesthesia Plan: Bier Block and Bier Block-LIDOCAINE ONLY   Post-op Pain Management:    Induction: Intravenous  PONV Risk Score and Plan: 2 and Propofol infusion, Treatment may vary due to age or medical condition, Ondansetron and Dexamethasone  Airway Management Planned: Simple Face Mask  Additional Equipment:   Intra-op Plan:   Post-operative Plan:   Informed Consent: I have reviewed the patients History and Physical, chart, labs and discussed the procedure including the risks, benefits and alternatives for the proposed anesthesia with the patient or authorized representative who has indicated his/her understanding and acceptance.     Dental advisory given  Plan Discussed with: CRNA  Anesthesia Plan Comments:        Anesthesia Quick Evaluation

## 2019-05-16 NOTE — Brief Op Note (Signed)
05/16/2019  2:43 PM  PATIENT:  Theresa Riggs  73 y.o. female  PRE-OPERATIVE DIAGNOSIS:  MUCOID CYST LEFT MIDDLE FINGER  POST-OPERATIVE DIAGNOSIS:  MUCOID CYST LEFT MIDDLE FINGER  PROCEDURE:  Procedure(s) with comments: EXCISION MUCOID CYST LEFT MIDDLE FINGER, DISTAL INTERPHALANGEAL JOINT WITH ARTHROTOMY AND ROTATION FLAP (Left) - IV REGIONL UPPER ARM BLOCK  SURGEON:  Surgeon(s) and Role:    * Daryll Brod, MD - Primary  PHYSICIAN ASSISTANT:   ASSISTANTS: none   ANESTHESIA:   local, regional and IV sedation  EBL: 71mlBLOOD ADMINISTERED:none  DRAINS: none   LOCAL MEDICATIONS USED:  BUPIVICAINE   SPECIMEN:  Excision  DISPOSITION OF SPECIMEN:  PATHOLOGY  COUNTS:  YES  TOURNIQUET:   Total Tourniquet Time Documented: Upper Arm (Left) - 32 minutes Total: Upper Arm (Left) - 32 minutes   DICTATION: .Viviann Spare Dictation  PLAN OF CARE: Discharge to home after PACU  PATIENT DISPOSITION:  PACU - hemodynamically stable.

## 2019-05-16 NOTE — Anesthesia Postprocedure Evaluation (Signed)
Anesthesia Post Note  Patient: LEZA ASHDOWN  Procedure(s) Performed: EXCISION MUCOID CYST LEFT MIDDLE FINGER, DISTAL INTERPHALANGEAL JOINT WITH ARTHROTOMY AND ROTATION FLAP (Left Finger)     Patient location during evaluation: PACU Anesthesia Type: Bier Block Level of consciousness: awake and alert Pain management: pain level controlled Vital Signs Assessment: post-procedure vital signs reviewed and stable Respiratory status: spontaneous breathing, nonlabored ventilation, respiratory function stable and patient connected to nasal cannula oxygen Cardiovascular status: stable and blood pressure returned to baseline Postop Assessment: no apparent nausea or vomiting Anesthetic complications: no    Last Vitals:  Vitals:   05/16/19 1515 05/16/19 1553  BP: (!) 146/74 (!) 141/71  Pulse: 65 80  Resp: 18 18  Temp:  36.5 C  SpO2: 100% 100%    Last Pain:  Vitals:   05/16/19 1553  TempSrc:   PainSc: 0-No pain                 Ryan P Ellender

## 2019-05-16 NOTE — H&P (Signed)
Theresa Riggs is an 73 y.o. female.   Chief Complaint:mass left middle fingerHPI: Theresa Riggs is a 73 year old right-hand-dominant female referred by Dr. Iona Beard for consultation regarding a mass over the dorsal aspect of her left middle finger. This been present for several months and is not causing her any significant discomfort. States it is opened and drained approximately 2 to 3 weeks ago. It was clear fluid. She has no history of injury. She does have history of arthritis. She states nothing makes it better or worse. She does not have any history of diabetes thyroid problems or gout family history is positive arthritis negative for the remainder. Past Medical    Past Medical History:  Diagnosis Date  . Cervical dysplasia   . Cervical polyp   . 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 GERD (gastroesophageal reflux disease)   . Palpitations   . PUD (peptic ulcer disease)     Past Surgical History:  Procedure Laterality Date  . CHOLECYSTECTOMY    . DILATION AND CURETTAGE OF UTERUS    . FOOT SURGERY    . LEEP    . TUBAL LIGATION     BTL    Family History  Problem Relation Age of Onset  . Drug abuse Sister   . Arthritis Brother        rhuematoid   Social History:  reports that she has never smoked. She has never used smokeless tobacco. She reports that she does not drink alcohol or use drugs.  Allergies: No Known Allergies  No medications prior to admission.    No results found for this or any previous visit (from the past 48 hour(s)).  No results found.   Pertinent items are noted in HPI.  Height 5\' 2"  (1.575 m), weight 59 kg.  General appearance: alert, cooperative and appears stated age Head: Normocephalic, without obvious abnormality Neck: no JVD Resp: clear to auscultation bilaterally Cardio: regular rate and rhythm, S1, S2 normal, no murmur, click, rub or gallop GI: soft, non-tender; bowel sounds normal; no masses,  no organomegaly Extremities: translucent  mass left middle finger Pulses: 2+ and symmetric Skin: Skin color, texture, turgor normal. No rashes or lesions Neurologic: Grossly normal Incision/Wound: na  Assessment/Plan Assessment:  1. Mucoid cyst of joint  Left middle 2. Osteoarthritis of finger of left hand    Plan: Have discussed excision of the cyst with the rotation of a skin flap for closure. The cyst measures approximately 1.2 cm in diameter. The peri and postoperative course are discussed along with risk and complications. They are aware that there is no guarantee to the surgery possibility of infection recurrence injury to arteries nerves tendons recurrence of the cyst. He is scheduled for excision cyst debridement joint with dorsal rotation flap left middle finger as an outpatient under regional anesthesia. Questions are encouraged and answered to her and her husband satisfaction.   Daryll Brod 05/16/2019, 6:35 AM

## 2019-05-16 NOTE — Transfer of Care (Signed)
Immediate Anesthesia Transfer of Care Note  Patient: Theresa Riggs  Procedure(s) Performed: EXCISION MUCOID CYST LEFT MIDDLE FINGER, DISTAL INTERPHALANGEAL JOINT WITH ARTHROTOMY AND ROTATION FLAP (Left Finger)  Patient Location: PACU  Anesthesia Type:MAC and Bier block  Level of Consciousness: awake, alert  and oriented  Airway & Oxygen Therapy: Patient Spontanous Breathing and Patient connected to face mask oxygen  Post-op Assessment: Report given to RN and Post -op Vital signs reviewed and stable  Post vital signs: Reviewed and stable  Last Vitals:  Vitals Value Taken Time  BP    Temp    Pulse 77 05/16/19 1447  Resp 16 05/16/19 1447  SpO2 100 % 05/16/19 1447  Vitals shown include unvalidated device data.  Last Pain:  Vitals:   05/16/19 1300  TempSrc: Oral  PainSc: 0-No pain         Complications: No apparent anesthesia complications

## 2019-05-16 NOTE — Op Note (Signed)
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   ASSISTANT: none   ANESTHESIA:  Bier block with sedation and Local   INTRAVENOUS FLUIDS:  Per anesthesia flow sheet.   ESTIMATED BLOOD LOSS:  Minimal.   COMPLICATIONS:  None.   SPECIMENS:   Excision cyst osteophyte synovial tissue   TOURNIQUET TIME:    Total Tourniquet Time Documented: Upper Arm (Left) - 32 minutes Total: Upper Arm (Left) - 32 minutes    DISPOSITION:  Stable to PACU.   INDICATIONS: Patient is a 73 year old female with a history of a large mucoid cyst over the dorsal aspect just distal to the distal phalangeal joint left middle finger.  The skin is markedly translucent.  She is admitted for excision of the cyst debridement of the joint with possible rotation flap.  Preperi-and postoperative course been discussed along with risks and complications.  She is aware that there is no guarantee to the surgery the possibility of infection recurrence injury to arteries nerves tendons complete relief symptoms and dystrophy.  In the preoperative area the patient is seen the extremity marked by both patient and surgeon antibiotic given  OPERATIVE COURSE: Patient is brought to the operating room where an upper arm IV regional anesthetic was carried out without difficulty under the direction the anesthesia department.  She was prepped using ChloraPrep and in supine position with full left arm free.  A 3-minute dry time was allowed and timeout taken to confirm patient procedure.  A  metacarpal block was given with quarter percent bupivacaine without epinephrine approximately 10 cc was used.  A curvilinear incision was made over the distal inner phalangeal joint and the cyst and skin which was entirely translucent was excised.  The specimen was sent to pathology.  The incision was then carried along the lateral margin of the finger to the PIP joint level area.  This was then brought across the dorsum of the hand with a backcut.  The joint was then opened and a debridement was then performed to the osteophytes on the middle phalanx using a hemostatic rondure and house curette.  The specimen was sent to pathology along with synovial tissue excised.  The skin flap was then developed.  This allowed the dorsal skin to be rotated in to the entire defect.  The joint and the area was copiously irrigated with saline.  The skin was then closed with interrupted 4-0 nylon sutures.  There was no tension on the flap.  A sterile dressing dorsal splint was applied to the finger.  Inflation of the tourniquet remaining fingers pink.  She was taken to the recovery room for observation in satisfactory condition.  She will be discharged home to return [place] in 1 week Tylenol ibuprofen for pain with Ultram for backup breakthrough.   Daryll Brod, MD Electronically signed, 05/16/19

## 2019-05-19 ENCOUNTER — Encounter (HOSPITAL_BASED_OUTPATIENT_CLINIC_OR_DEPARTMENT_OTHER): Payer: Self-pay | Admitting: Orthopedic Surgery

## 2019-05-19 LAB — SURGICAL PATHOLOGY

## 2022-02-06 ENCOUNTER — Other Ambulatory Visit: Payer: Self-pay | Admitting: Orthopedic Surgery

## 2022-02-06 ENCOUNTER — Other Ambulatory Visit: Payer: Self-pay

## 2022-02-06 ENCOUNTER — Encounter (HOSPITAL_BASED_OUTPATIENT_CLINIC_OR_DEPARTMENT_OTHER): Payer: Self-pay | Admitting: Orthopedic Surgery

## 2022-02-07 NOTE — Progress Notes (Signed)

## 2022-02-13 ENCOUNTER — Encounter (HOSPITAL_BASED_OUTPATIENT_CLINIC_OR_DEPARTMENT_OTHER)
Admission: RE | Disposition: A | Discharge status: Home / Self Care | Payer: Self-pay | Source: Home / Self Care | Attending: Orthopedic Surgery

## 2022-02-13 ENCOUNTER — Other Ambulatory Visit: Payer: Self-pay

## 2022-02-13 ENCOUNTER — Encounter (HOSPITAL_BASED_OUTPATIENT_CLINIC_OR_DEPARTMENT_OTHER): Payer: Self-pay | Admitting: Orthopedic Surgery

## 2022-02-13 ENCOUNTER — Ambulatory Visit (HOSPITAL_BASED_OUTPATIENT_CLINIC_OR_DEPARTMENT_OTHER)
Admission: RE | Admit: 2022-02-13 | Discharge: 2022-02-13 | Disposition: A | Discharge status: Home / Self Care | Payer: Medicare Other | Attending: Orthopedic Surgery | Admitting: Orthopedic Surgery

## 2022-02-13 ENCOUNTER — Ambulatory Visit (HOSPITAL_BASED_OUTPATIENT_CLINIC_OR_DEPARTMENT_OTHER): Payer: Medicare Other | Admitting: Anesthesiology

## 2022-02-13 DIAGNOSIS — K219 Gastro-esophageal reflux disease without esophagitis: Secondary | ICD-10-CM | POA: Insufficient documentation

## 2022-02-13 DIAGNOSIS — M67432 Ganglion, left wrist: Secondary | ICD-10-CM | POA: Diagnosis present

## 2022-02-13 HISTORY — PX: GANGLION CYST EXCISION: SHX1691

## 2022-02-13 SURGERY — EXCISION, GANGLION CYST, WRIST
Anesthesia: General | Site: Wrist | Laterality: Left

## 2022-02-13 MED ORDER — HYDROMORPHONE HCL 1 MG/ML IJ SOLN: 0.2500 mg | INTRAMUSCULAR | Status: DC | PRN

## 2022-02-13 MED ORDER — CEFAZOLIN SODIUM-DEXTROSE 2-4 GM/100ML-% IV SOLN
INTRAVENOUS | Status: AC
  Filled 2022-02-13: qty 100

## 2022-02-13 MED ORDER — CEFAZOLIN SODIUM-DEXTROSE 2-4 GM/100ML-% IV SOLN
2.0000 g | INTRAVENOUS | Status: AC
  Administered 2022-02-13: 2 g via INTRAVENOUS

## 2022-02-13 MED ORDER — BUPIVACAINE HCL (PF) 0.25 % IJ SOLN
INTRAMUSCULAR | Status: DC | PRN
  Administered 2022-02-13: 9 mL

## 2022-02-13 MED ORDER — OXYCODONE HCL 5 MG/5ML PO SOLN: 5.0000 mg | Freq: Once | ORAL | Status: DC | PRN

## 2022-02-13 MED ORDER — AMISULPRIDE (ANTIEMETIC) 5 MG/2ML IV SOLN
10.0000 mg | Freq: Once | INTRAVENOUS | Status: DC | PRN
Start: 2022-02-13 — End: 2022-02-13

## 2022-02-13 MED ORDER — ONDANSETRON HCL 4 MG/2ML IJ SOLN
INTRAMUSCULAR | Status: DC | PRN
  Administered 2022-02-13: 4 mg via INTRAVENOUS

## 2022-02-13 MED ORDER — LIDOCAINE 2% (20 MG/ML) 5 ML SYRINGE
INTRAMUSCULAR | Status: DC | PRN
  Administered 2022-02-13: 60 mg via INTRAVENOUS

## 2022-02-13 MED ORDER — FENTANYL CITRATE (PF) 100 MCG/2ML IJ SOLN
INTRAMUSCULAR | Status: DC | PRN
  Administered 2022-02-13: 50 ug via INTRAVENOUS
  Administered 2022-02-13: 25 ug via INTRAVENOUS

## 2022-02-13 MED ORDER — HYDROCODONE-ACETAMINOPHEN 5-325 MG PO TABS: ORAL_TABLET | ORAL | 0 refills | Status: AC

## 2022-02-13 MED ORDER — PROPOFOL 10 MG/ML IV BOLUS
INTRAVENOUS | Status: AC
  Filled 2022-02-13: qty 20

## 2022-02-13 MED ORDER — DEXAMETHASONE SODIUM PHOSPHATE 10 MG/ML IJ SOLN
INTRAMUSCULAR | Status: AC
  Filled 2022-02-13: qty 1

## 2022-02-13 MED ORDER — LIDOCAINE 2% (20 MG/ML) 5 ML SYRINGE
INTRAMUSCULAR | Status: AC
  Filled 2022-02-13: qty 5

## 2022-02-13 MED ORDER — LACTATED RINGERS IV SOLN: INTRAVENOUS | Status: DC

## 2022-02-13 MED ORDER — 0.9 % SODIUM CHLORIDE (POUR BTL) OPTIME
TOPICAL | Status: DC | PRN
  Administered 2022-02-13: 75 mL

## 2022-02-13 MED ORDER — DEXAMETHASONE SODIUM PHOSPHATE 10 MG/ML IJ SOLN
INTRAMUSCULAR | Status: DC | PRN
  Administered 2022-02-13: 4 mg via INTRAVENOUS

## 2022-02-13 MED ORDER — FENTANYL CITRATE (PF) 100 MCG/2ML IJ SOLN
INTRAMUSCULAR | Status: AC
  Filled 2022-02-13: qty 2

## 2022-02-13 MED ORDER — PROPOFOL 10 MG/ML IV BOLUS
INTRAVENOUS | Status: DC | PRN
  Administered 2022-02-13: 150 mg via INTRAVENOUS

## 2022-02-13 MED ORDER — ONDANSETRON HCL 4 MG/2ML IJ SOLN
INTRAMUSCULAR | Status: AC
  Filled 2022-02-13: qty 2

## 2022-02-13 MED ORDER — OXYCODONE HCL 5 MG PO TABS: 5.0000 mg | ORAL_TABLET | Freq: Once | ORAL | Status: DC | PRN

## 2022-02-13 MED ORDER — PROMETHAZINE HCL 25 MG/ML IJ SOLN: 6.2500 mg | INTRAMUSCULAR | Status: DC | PRN

## 2022-02-13 SURGICAL SUPPLY — 54 items
APL PRP STRL LF DISP 70% ISPRP (MISCELLANEOUS) ×1
APL SKNCLS STERI-STRIP NONHPOA (GAUZE/BANDAGES/DRESSINGS) ×1
BENZOIN TINCTURE PRP APPL 2/3 (GAUZE/BANDAGES/DRESSINGS) IMPLANT
BLADE MINI RND TIP GREEN BEAV (BLADE) IMPLANT
BLADE SURG 15 STRL LF DISP TIS (BLADE) ×4 IMPLANT
BLADE SURG 15 STRL SS (BLADE) ×2
BNDG CMPR 9X4 STRL LF SNTH (GAUZE/BANDAGES/DRESSINGS) ×1
BNDG ELASTIC 2X5.8 VLCR STR LF (GAUZE/BANDAGES/DRESSINGS) IMPLANT
BNDG ELASTIC 3X5.8 VLCR STR LF (GAUZE/BANDAGES/DRESSINGS) ×2 IMPLANT
BNDG ESMARK 4X9 LF (GAUZE/BANDAGES/DRESSINGS) IMPLANT
BNDG GAUZE DERMACEA FLUFF (GAUZE/BANDAGES/DRESSINGS) ×1
BNDG GAUZE DERMACEA FLUFF 4 (GAUZE/BANDAGES/DRESSINGS) ×2 IMPLANT
BNDG GZE DERMACEA 4 6PLY (GAUZE/BANDAGES/DRESSINGS) ×1
CHLORAPREP W/TINT 26 (MISCELLANEOUS) ×2 IMPLANT
CORD BIPOLAR FORCEPS 12FT (ELECTRODE) ×2 IMPLANT
COVER BACK TABLE 60X90IN (DRAPES) ×2 IMPLANT
COVER MAYO STAND STRL (DRAPES) ×2 IMPLANT
CUFF TOURN SGL QUICK 18X4 (TOURNIQUET CUFF) ×2 IMPLANT
DRAPE EXTREMITY T 121X128X90 (DISPOSABLE) ×2 IMPLANT
DRAPE SURG 17X23 STRL (DRAPES) ×2 IMPLANT
DRSG PAD ABDOMINAL 8X10 ST (GAUZE/BANDAGES/DRESSINGS) IMPLANT
GAUZE SPONGE 4X4 12PLY STRL (GAUZE/BANDAGES/DRESSINGS) ×2 IMPLANT
GAUZE XEROFORM 1X8 LF (GAUZE/BANDAGES/DRESSINGS) ×2 IMPLANT
GLOVE BIO SURGEON STRL SZ7.5 (GLOVE) ×2 IMPLANT
GLOVE BIOGEL PI IND STRL 7.0 (GLOVE) IMPLANT
GLOVE BIOGEL PI IND STRL 8 (GLOVE) ×2 IMPLANT
GLOVE BIOGEL PI INDICATOR 7.0 (GLOVE) ×2
GLOVE BIOGEL PI INDICATOR 8 (GLOVE) ×2
GLOVE SURG SS PI 6.5 STRL IVOR (GLOVE) IMPLANT
GLOVE SURG SYN 7.5  E (GLOVE) ×1
GLOVE SURG SYN 7.5 E (GLOVE) ×1 IMPLANT
GLOVE SURG SYN 7.5 PF PI (GLOVE) IMPLANT
GOWN STRL REUS W/ TWL LRG LVL3 (GOWN DISPOSABLE) ×2 IMPLANT
GOWN STRL REUS W/TWL LRG LVL3 (GOWN DISPOSABLE) ×2
GOWN STRL REUS W/TWL XL LVL3 (GOWN DISPOSABLE) ×2 IMPLANT
NDL HYPO 25X1 1.5 SAFETY (NEEDLE) IMPLANT
NEEDLE HYPO 25X1 1.5 SAFETY (NEEDLE) ×1 IMPLANT
NS IRRIG 1000ML POUR BTL (IV SOLUTION) ×2 IMPLANT
PACK BASIN DAY SURGERY FS (CUSTOM PROCEDURE TRAY) ×2 IMPLANT
PAD CAST 3X4 CTTN HI CHSV (CAST SUPPLIES) IMPLANT
PADDING CAST ABS 4INX4YD NS (CAST SUPPLIES)
PADDING CAST ABS COTTON 4X4 ST (CAST SUPPLIES) ×2 IMPLANT
PADDING CAST COTTON 3X4 STRL (CAST SUPPLIES)
SPLINT PLASTER CAST XFAST 3X15 (CAST SUPPLIES) IMPLANT
SPLINT PLASTER XTRA FASTSET 3X (CAST SUPPLIES)
STOCKINETTE 4X48 STRL (DRAPES) ×2 IMPLANT
STRIP CLOSURE SKIN 1/2X4 (GAUZE/BANDAGES/DRESSINGS) IMPLANT
SUT MNCRL AB 4-0 PS2 18 (SUTURE) IMPLANT
SUT MON AB 5-0 PS2 18 (SUTURE) IMPLANT
SUT VIC AB 4-0 P2 18 (SUTURE) IMPLANT
SYR BULB EAR ULCER 3OZ GRN STR (SYRINGE) ×2 IMPLANT
SYR CONTROL 10ML LL (SYRINGE) IMPLANT
TOWEL GREEN STERILE FF (TOWEL DISPOSABLE) ×4 IMPLANT
UNDERPAD 30X36 HEAVY ABSORB (UNDERPADS AND DIAPERS) ×2 IMPLANT

## 2022-02-13 NOTE — Anesthesia Procedure Notes (Signed)
Procedure Name: LMA Insertion Date/Time: 02/13/2022 12:54 PM  Performed by: Maryella Shivers, CRNAPre-anesthesia Checklist: Patient identified, Emergency Drugs available, Suction available and Patient being monitored Patient Re-evaluated:Patient Re-evaluated prior to induction Oxygen Delivery Method: Circle system utilized Preoxygenation: Pre-oxygenation with 100% oxygen Induction Type: IV induction Ventilation: Mask ventilation without difficulty LMA: LMA inserted LMA Size: 4.0 Number of attempts: 1 Airway Equipment and Method: Bite block Placement Confirmation: positive ETCO2 Tube secured with: Tape Dental Injury: Teeth and Oropharynx as per pre-operative assessment

## 2022-02-13 NOTE — Anesthesia Preprocedure Evaluation (Signed)
Anesthesia Evaluation  Patient identified by MRN, date of birth, ID band Patient awake    Reviewed: Allergy & Precautions, NPO status , Patient's Chart, lab work & pertinent test results  Airway Mallampati: I  TM Distance: >3 FB Neck ROM: Full    Dental no notable dental hx.    Pulmonary neg pulmonary ROS,    Pulmonary exam normal breath sounds clear to auscultation       Cardiovascular negative cardio ROS Normal cardiovascular exam Rhythm:Regular Rate:Normal     Neuro/Psych  Headaches, negative psych ROS   GI/Hepatic Neg liver ROS, PUD, GERD  Medicated and Controlled,  Endo/Other  negative endocrine ROS  Renal/GU negative Renal ROS     Musculoskeletal negative musculoskeletal ROS (+)   Abdominal   Peds  Hematology negative hematology ROS (+)   Anesthesia Other Findings MUCOID CYST LEFT MIDDLE FINGER  Reproductive/Obstetrics                             Anesthesia Physical  Anesthesia Plan  ASA: II  Anesthesia Plan: General   Post-op Pain Management: Minimal or no pain anticipated   Induction: Intravenous  PONV Risk Score and Plan: 3 and Treatment may vary due to age or medical condition, Ondansetron, Dexamethasone and Midazolam  Airway Management Planned: LMA  Additional Equipment:   Intra-op Plan:   Post-operative Plan: Extubation in OR  Informed Consent: I have reviewed the patients History and Physical, chart, labs and discussed the procedure including the risks, benefits and alternatives for the proposed anesthesia with the patient or authorized representative who has indicated his/her understanding and acceptance.     Dental advisory given  Plan Discussed with: CRNA  Anesthesia Plan Comments:         Anesthesia Quick Evaluation

## 2022-02-13 NOTE — Op Note (Signed)
NAME: Theresa Riggs MEDICAL RECORD NO: [id number] DATE OF BIRTH: 1945-11-30 FACILITY: [facility] LOCATION: [facility] PHYSICIAN: Tennis Must, MD   OPERATIVE REPORT   DATE OF PROCEDURE: 02/13/22    PREOPERATIVE DIAGNOSIS: Left wrist volar ganglion cyst   POSTOPERATIVE DIAGNOSIS: Left wrist volar ganglion cyst   PROCEDURE: Excision of left wrist volar ganglion cyst   SURGEON:  Leanora Cover, M.D.   ASSISTANT: none   ANESTHESIA:  General   INTRAVENOUS FLUIDS:  Per anesthesia flow sheet.   ESTIMATED BLOOD LOSS:  Minimal.   COMPLICATIONS:  None.   SPECIMENS: Left wrist ganglion cyst to pathology   TOURNIQUET TIME:    Total Tourniquet Time Documented: Upper Arm (Left) - 16 minutes Total: Upper Arm (Left) - 16 minutes    DISPOSITION:  Stable to PACU.   INDICATIONS: 76 year old female has noted a mass on the volar aspect of her left wrist.  This is bothersome to her.  She wishes to have it removed.  Risks, benefits and alternatives of surgery were discussed including the risks of blood loss, infection, damage to nerves, vessels, tendons, ligaments, bone for surgery, need for additional surgery, complications with wound healing, continued pain, stiffness, , recurrence.  She voiced understanding of these risks and elected to proceed.  OPERATIVE COURSE:  After being identified preoperatively by myself,  the patient and I agreed on the procedure and site of the procedure.  The surgical site was marked.  Surgical consent had been signed. Preoperative IV antibiotic prophylaxis was given. She was transferred to the operating room and placed on the operating table in supine position with the Left upper extremity on an arm board.  General anesthesia was induced by the anesthesiologist.  Left upper extremity was prepped and draped in normal sterile orthopedic fashion.  A surgical pause was performed between the surgeons, anesthesia, and operating room staff and all were in  agreement as to the patient, procedure, and site of procedure.  Tourniquet at the proximal aspect of the extremity was inflated to 250 mmHg after exsanguination of the arm with an Esmarch bandage.  A zigzag incision was made over the mass at the volar radial aspect of the wrist.  This was carried into subcutaneous tissues by spreading technique.  Bipolar electrocautery was used to obtain hemostasis.  Mass was easily identified.  The superficial palmar branch of the radial artery was adherent to it.  This was carefully freed up and protected throughout the case.  The mass was freed up from surrounding soft tissues.  There was a stalk coming from the [facility] joint to the radial side of the FCR tendon.  The mass was removed and sent to pathology for examination.  It was filled with clear gelatinous fluid.  A 4-0 Vicryl suture was used to place a figure-of-eight stitch in the rent in the capsule to try to prevent recurrence.  The wound was copiously irrigated with sterile saline.  It was closed with inverted interrupted Vicryl sutures in the subcutaneous tissues and a running subcuticular 4-0 Monocryl stitch.  This was augmented with benzoin and Steri-Strips.  The wound was injected with quarter percent plain Marcaine to aid in postoperative analgesia.  It was dressed with sterile 4 x 4's and ABD and wrapped with Kerlix and Ace bandage.  The tourniquet was deflated at 16 minutes.  Fingertips were pink with brisk capillary refill after deflation of tourniquet.  The operative  drapes were broken down.  The patient was awoken from anesthesia  safely.  She was transferred back to the stretcher and taken to PACU in stable condition.  I will see her back in the office in 1 week for postoperative followup.  I will give her a prescription for Norco 5/325 1-2 tabs PO q6 hours prn pain, dispense # 15.   Leanora Cover, MD Electronically signed, 02/13/22

## 2022-02-13 NOTE — Discharge Instructions (Addendum)

## 2022-02-13 NOTE — Transfer of Care (Signed)
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  Last Pain:  Vitals:   02/13/22 1126  TempSrc: Oral  PainSc: 0-No pain      Patients Stated Pain Goal: 3 (23/76/28 3151)  Complications: No notable events documented.

## 2022-02-13 NOTE — Anesthesia Postprocedure Evaluation (Signed)
Anesthesia Post Note  Patient: Theresa Riggs  Procedure(s) Performed: EXCISION LEFT WRIST VOLAR GANGLION (Left: Wrist)     Patient location during evaluation: PACU Anesthesia Type: General Level of consciousness: awake and alert Pain management: pain level controlled Vital Signs Assessment: post-procedure vital signs reviewed and stable Respiratory status: spontaneous breathing, nonlabored ventilation and respiratory function stable Cardiovascular status: blood pressure returned to baseline and stable Postop Assessment: no apparent nausea or vomiting Anesthetic complications: no   No notable events documented.  Last Vitals:  Vitals:   02/13/22 1345 02/13/22 1424  BP: 130/64 139/72  Pulse: 60 65  Resp: 11 12  Temp:  (!) 36.1 C  SpO2: 97% 96%    Last Pain:  Vitals:   02/13/22 1424  TempSrc:   PainSc: 3                  Lynda Rainwater

## 2022-02-13 NOTE — H&P (Signed)
  Theresa Riggs is an 76 y.o. female.   Chief Complaint: ganglion cyst HPI: 76 yo female with left wrist volar ganglion cyst.  This is bothersome to her.  She wishes to have it removed.  Allergies: No Known Allergies  Past Medical History:  Diagnosis Date   Cervical dysplasia    Cervical polyp    GERD (gastroesophageal reflux disease)    Palpitations    PUD (peptic ulcer disease)     Past Surgical History:  Procedure Laterality Date   CHOLECYSTECTOMY     DILATION AND CURETTAGE OF UTERUS     FOOT SURGERY     LEEP     MASS EXCISION Left 05/16/2019   Procedure: EXCISION MUCOID CYST LEFT MIDDLE FINGER, DISTAL INTERPHALANGEAL JOINT WITH ARTHROTOMY AND ROTATION FLAP;  Surgeon: Daryll Brod, MD;  Location: [facility];  Service: Orthopedics;  Laterality: Left;  IV REGIONL UPPER ARM BLOCK   TUBAL LIGATION     BTL    Family History: Family History  Problem Relation Age of Onset   Drug abuse Sister    Arthritis Brother        rhuematoid    Social History:   reports that she has never smoked. She has never used smokeless tobacco. She reports that she does not drink alcohol and does not use drugs.  Medications: No medications prior to admission.    No results found for this or any previous visit (from the past 48 hour(s)).  No results found.    Height '5\' 2"'$  (1.575 m), weight 58.1 kg.  General appearance: alert, cooperative, and appears stated age Head: Normocephalic, without obvious abnormality, atraumatic Neck: supple, symmetrical, trachea midline Extremities: Intact sensation and capillary refill all digits.  +epl/fpl/io.  No wounds.  Pulses: 2+ and symmetric Skin: Skin color, texture, turgor normal. No rashes or lesions Neurologic: Grossly normal Incision/Wound: none  Assessment/Plan Left wrist volar ganglion cyst.  Non operative and operative treatment options have been discussed with the patient and patient wishes to proceed with operative  treatment. Risks, benefits, and alternatives of surgery have been discussed and the patient agrees with the plan of care.   Leanora Cover 02/13/2022, 9:27 AM

## 2022-02-14 ENCOUNTER — Encounter (HOSPITAL_BASED_OUTPATIENT_CLINIC_OR_DEPARTMENT_OTHER): Payer: Self-pay | Admitting: Orthopedic Surgery

## 2022-02-14 LAB — SURGICAL PATHOLOGY
